# Patient Record
Sex: Male | Born: 2005 | Race: Black or African American | Hispanic: No | Marital: Single | Smoking: Current every day smoker
Health system: Southern US, Community
[De-identification: ages and names within clinical notes are randomized; demographics above are authoritative.]

## PROBLEM LIST (undated history)

## (undated) DIAGNOSIS — T7840XA Allergy, unspecified, initial encounter: Secondary | ICD-10-CM

## (undated) HISTORY — DX: Allergy, unspecified, initial encounter: T78.40XA

## (undated) HISTORY — PX: TONSILLECTOMY: SUR1361

---
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
## 2023-03-27 ENCOUNTER — Emergency Department (HOSPITAL_BASED_OUTPATIENT_CLINIC_OR_DEPARTMENT_OTHER)
Admission: EM | Admit: 2023-03-27 | Discharge: 2023-03-27 | Disposition: A | Discharge status: Home / Self Care | Payer: Medicaid Other | Attending: Emergency Medicine | Admitting: Emergency Medicine

## 2023-03-27 ENCOUNTER — Encounter (HOSPITAL_BASED_OUTPATIENT_CLINIC_OR_DEPARTMENT_OTHER): Payer: Self-pay | Admitting: Emergency Medicine

## 2023-03-27 ENCOUNTER — Emergency Department (HOSPITAL_BASED_OUTPATIENT_CLINIC_OR_DEPARTMENT_OTHER): Payer: Medicaid Other | Admitting: Radiology

## 2023-03-27 DIAGNOSIS — R4020 Unspecified coma: Secondary | ICD-10-CM | POA: Diagnosis not present

## 2023-03-27 DIAGNOSIS — R42 Dizziness and giddiness: Secondary | ICD-10-CM | POA: Diagnosis not present

## 2023-03-27 DIAGNOSIS — R55 Syncope and collapse: Secondary | ICD-10-CM | POA: Diagnosis not present

## 2023-03-27 LAB — BASIC METABOLIC PANEL
Anion gap: 7 (ref 5–15)
BUN: 5 mg/dL — ABNORMAL LOW (ref 6–20)
CO2: 31 mmol/L (ref 22–32)
Calcium: 9.3 mg/dL (ref 8.9–10.3)
Chloride: 101 mmol/L (ref 98–111)
Creatinine, Ser: 0.78 mg/dL (ref 0.61–1.24)
GFR, Estimated: >60 mL/min (ref >60)
Glucose, Bld: 85 mg/dL (ref 70–99)
Potassium: 3.9 mmol/L (ref 3.5–5.1)
Sodium: 139 mmol/L (ref 135–145)

## 2023-03-27 LAB — CBC WITH DIFFERENTIAL/PLATELET
Abs Immature Granulocytes: 0.01 10*3/uL (ref 0.00–0.07)
Basophils Absolute: 0.1 10*3/uL (ref 0.0–0.1)
Basophils Relative: 2 %
Eosinophils Absolute: 0.2 10*3/uL (ref 0.0–0.5)
Eosinophils Relative: 5 %
HCT: 43.8 % (ref 39.0–52.0)
Hemoglobin: 15 g/dL (ref 13.0–17.0)
Immature Granulocytes: 0 %
Lymphocytes Relative: 38 %
Lymphs Abs: 1.7 10*3/uL (ref 0.7–4.0)
MCH: 29.8 pg (ref 26.0–34.0)
MCHC: 34.2 g/dL (ref 30.0–36.0)
MCV: 86.9 fL (ref 80.0–100.0)
Monocytes Absolute: 0.4 10*3/uL (ref 0.1–1.0)
Monocytes Relative: 9 %
Neutro Abs: 2.2 10*3/uL (ref 1.7–7.7)
Neutrophils Relative %: 46 %
Platelets: 300 10*3/uL (ref 150–400)
RBC: 5.04 MIL/uL (ref 4.22–5.81)
RDW: 13.1 % (ref 11.5–15.5)
WBC: 4.6 10*3/uL (ref 4.0–10.5)
nRBC: 0 % (ref 0.0–0.2)

## 2023-03-27 LAB — URINALYSIS, ROUTINE W REFLEX MICROSCOPIC
Bilirubin Urine: NEGATIVE
Glucose, UA: NEGATIVE mg/dL
Hgb urine dipstick: NEGATIVE
Ketones, ur: NEGATIVE mg/dL
Leukocytes,Ua: NEGATIVE
Nitrite: NEGATIVE
Protein, ur: NEGATIVE mg/dL
Specific Gravity, Urine: 1.014 (ref 1.005–1.030)
pH: 7 (ref 5.0–8.0)

## 2023-03-27 LAB — CBG MONITORING, ED
Glucose-Capillary: 85 mg/dL (ref 70–99)
Glucose-Capillary: 84 mg/dL (ref 70–99)

## 2023-03-27 NOTE — Discharge Instructions (Addendum)
 Please follow-up outpatient with a primary care provider for further management.  Your orthostatic vitals were reassuring as was your EKG, chest x-ray and screening laboratory workup.  Your symptoms are consistent with likely vasovagal syncope which is a benign cause of syncope.  Drink plenty of fluids to maintain hydration, if you have recurrent episodes of passing out, this would likely warrant follow-up with a cardiologist.

## 2023-03-27 NOTE — ED Notes (Signed)
Pt brought back to triage. For VS recheck. Denies dizziness/lightheaded. Reports 6/10 headache with nausea. No head injury from LOC today. Pt sts he was standing when he eased himself to the ground.

## 2023-03-27 NOTE — ED Triage Notes (Signed)
Reports LOC today around 1pm Was talking to someone and "fainted"  Lasted a few seconds and  felt weak after  Similar episode 2 weeks ago

## 2023-03-27 NOTE — ED Provider Notes (Signed)
 [place] EMERGENCY DEPARTMENT AT [facility] Provider Note   CSN: [id number] Arrival date & time: 03/27/23  1434     History  Chief Complaint  Patient presents with   Loss of Consciousness    Brad Curry is a 18 y.o. male.   Loss of Consciousness    18 year old male presents to the emergency department after syncopal episode.  The patient states that he lost consciousness today around 1 PM.  He felt a prodrome of lightheadedness.  He denied any chest pain, shortness of breath, nausea.  He had a similar episode 2 weeks with a similar prodrome of lightheadedness and mild diaphoresis.  He denies any history of sudden cardiac death in the family, no history of passing out sports.  Currently asymptomatic at the time of my evaluation.  Home Medications Prior to Admission medications   Medication Sig Start Date End Date Taking? Authorizing Provider  cetirizine  (ZYRTEC ) 10 MG tablet Take 1 tablet (10 mg total) by mouth daily. 06/19/17   Leta Crazier, MD  fluticasone  (FLONASE ) 50 MCG/ACT nasal spray Place 1 spray into both nostrils daily. 1 spray in each nostril every day Patient not taking: Reported on 03/22/2022 06/19/17   Leta Crazier, MD  naproxen  (NAPROSYN ) 500 MG tablet Take 1 tablet (500 mg total) by mouth 2 (two) times daily. Patient not taking: Reported on 03/22/2022 01/12/22   Bero, Michael M, MD      Allergies    Cinnamon    Review of Systems   Review of Systems  Cardiovascular:  Positive for syncope.  Neurological:  Positive for syncope and light-headedness.  All other systems reviewed and are negative.   Physical Exam Updated Vital Signs BP 132/83   Pulse 68   Temp 98.6 F (37 C)   Resp 18   SpO2 100%  Physical Exam Vitals and nursing note reviewed.  Constitutional:      General: He is not in acute distress.    Appearance: He is well-developed.  HENT:     Head: Normocephalic and atraumatic.  Eyes:     Conjunctiva/sclera: Conjunctivae  normal.  Cardiovascular:     Rate and Rhythm: Normal rate and regular rhythm.     Heart sounds: No murmur heard. Pulmonary:     Effort: Pulmonary effort is normal. No respiratory distress.     Breath sounds: Normal breath sounds.  Abdominal:     Palpations: Abdomen is soft.     Tenderness: There is no abdominal tenderness.  Musculoskeletal:        General: No swelling.     Cervical back: Neck supple.  Skin:    General: Skin is warm and dry.     Capillary Refill: Capillary refill takes less than 2 seconds.  Neurological:     Mental Status: He is alert.     Comments: MENTAL STATUS EXAM:    Orientation: Alert and oriented to person, place and time.  Memory: Cooperative, follows commands well.  Language: Speech is clear and language is normal.   CRANIAL NERVES:    CN 2 (Optic): Visual fields intact to confrontation.  CN 3,4,6 (EOM): Pupils equal and reactive to light. Full extraocular eye movement without nystagmus.  CN 5 (Trigeminal): Facial sensation is normal, no weakness of masticatory muscles.  CN 7 (Facial): No facial weakness or asymmetry.  CN 8 (Auditory): Auditory acuity grossly normal.  CN 9,10 (Glossophar): The uvula is midline, the palate elevates symmetrically.  CN 11 (spinal access): Normal sternocleidomastoid and trapezius strength.  CN 12 (Hypoglossal): The tongue is midline. No atrophy or fasciculations.SABRA   MOTOR:  Muscle Strength: 5/5RUE, 5/5LUE, 5/5RLE, 5/5LLE.   COORDINATION:   Intact finger-to-nose, no tremor.   SENSATION:   Intact to light touch all four extremities.  GAIT: Gait normal without ataxia   Psychiatric:        Mood and Affect: Mood normal.     ED Results / Procedures / Treatments   Labs (all labs ordered are listed, but only abnormal results are displayed) Labs Reviewed  BASIC METABOLIC PANEL - Abnormal; Notable for the following components:      Result Value   BUN 5 (*)    All other components within normal limits  CBC WITH  DIFFERENTIAL/PLATELET  URINALYSIS, ROUTINE W REFLEX MICROSCOPIC  CBG MONITORING, ED  CBG MONITORING, ED    EKG EKG Interpretation Date/Time:  Tuesday March 27 2023 15:31:04 EST Ventricular Rate:  64 PR Interval:  156 QRS Duration:  104 QT Interval:  366 QTC Calculation: 377 R Axis:   72  Text Interpretation: Normal sinus rhythm Incomplete right bundle branch block Borderline ECG No previous ECGs available Confirmed by Jerrol Agent ([id number]) on 03/27/2023 7:43:05 PM  Radiology No results found.  Procedures Procedures    Medications Ordered in ED Medications - No data to display  ED Course/ Medical Decision Making/ A&P                                 Medical Decision Making Amount and/or Complexity of Data Reviewed Labs: ordered. Radiology: ordered.    18 year old male presents to the emergency department after syncopal episode.  The patient states that he lost consciousness today around 1 PM.  He felt a prodrome of lightheadedness.  He denied any chest pain, shortness of breath, nausea.  He had a similar episode 2 weeks with a similar prodrome of lightheadedness and mild diaphoresis.  He denies any history of sudden cardiac death in the family, no history of passing out sports.  Currently asymptomatic at the time of my evaluation.  Initial Assessment:   With the patient's presentation of syncope, most likely diagnosis is orthostatic hypotension vs vasovagal episode. Other diagnoses were considered including (but not limited to) arrythmogenic syncope, valvular abnormality, PE, aortic dissection. These are considered less likely due to history of present illness and physical exam findings.   This is most consistent with an acute life/limb threatening illness complicated by underlying chronic conditions. In particular, concerning cardiac etiology, this is less likely to be the etiology given the lack of chest pain, lack of serious comorbidities including heart failure or CAD.    Additionally, patient's history appears more consistent with benign episodes including orthostatic hypotension or vagal episode based on his HPI.  Initial Plan:   Screening labs including CBC and Metabolic panel to evaluate for infectious or metabolic etiology of disease.  Urinalysis with reflex culture ordered to evaluate for UTI or relevant urologic/nephrologic pathology.  CXR to evaluate for structural/infectious intrathoracic pathology.  EKG to evaluate for cardiac pathology. Objective evaluation as below reviewed after administration of IVF/Telemetry monitoring  Initial Study Results:   Laboratory  All laboratory results reviewed without evidence of clinically relevant pathology.   Exceptions include: none, CBG normal   EKG EKG was reviewed independently. Rate, rhythm, axis, intervals all examined and without medically relevant abnormality. ST segments without concerns for elevations.  No evidence of HOCM, Brugada, WPW.  Radiology:  All images reviewed independently. Agree with radiology report at this time.   DG Chest 2 View Result Date: 03/27/2023 CLINICAL DATA:  Loss of consciousness today. Weakness afterwards. Similar episode 2 weeks ago. EXAM: CHEST - 2 VIEW COMPARISON:  02/21/2005 FINDINGS: The heart size and mediastinal contours are within normal limits. Both lungs are clear. The visualized skeletal structures are unremarkable. IMPRESSION: No active cardiopulmonary disease. Electronically Signed   By: Elsie Gravely M.D.   On: 03/27/2023 22:28         Final Assessment and Plan:   Patient reassuring EKG, chest x-ray, laboratory evaluation.  Will monitor for now currently asymptomatic, normal neurologic exam, orthostatics were collected and negative.  Patient is tolerating p.o. Symptoms likely consistent with vasovagal syncope.  Plan for outpatient PCP follow-up.  DC Instructions: Please follow-up outpatient with a primary care provider for further management.  Your  orthostatic vitals were reassuring as was your EKG, chest x-ray and screening laboratory workup.  Your symptoms are consistent with likely vasovagal syncope which is a benign cause of syncope.  Drink plenty of fluids to maintain hydration, if you have recurrent episodes of passing out, this would likely warrant follow-up with a cardiologist.   Clinical Impression:  1. Vasovagal syncope      Discharge     Final Clinical Impression(s) / ED Diagnoses Final diagnoses:  Vasovagal syncope    Rx / DC Orders ED Discharge Orders     None         Jerrol Agent, MD 03/30/23 2252

## 2023-04-26 ENCOUNTER — Ambulatory Visit: Admitting: Urgent Care

## 2023-04-26 ENCOUNTER — Ambulatory Visit: Payer: Self-pay

## 2023-04-26 NOTE — Telephone Encounter (Signed)
 Copied from CRM [id number]. Topic: Clinical - Red Word Triage >> Apr 26, 2023  2:03 PM Payton Doughty wrote: Red Word that prompted transfer to Nurse Triage: fainting/ passed out 2nd time in 2 months [facility] [place] location  Chief Complaint: lightheadedness/ syncopal episodes Symptoms: occasional lightheadedness , feels a flutter in his chest sometimes but does not cause lightheadedness Frequency: since November 2024 Pertinent Negatives: Patient denies any sx at present no sz activity noted when LOC, pt is alert and oriented x 3  Disposition: [] ED /[] Urgent Care (no appt availability in office) / [x] Appointment(In office/virtual)/ []  [facility]/ [] Home Care/ [] Refused Recommended Disposition /[] [facility]/ []  Follow-up with PCP Additional Notes: made NP today at [facility]  Reason for Disposition  [1] All other patients AND [2] now alert and feels fine  (Exception: SIMPLE FAINT due to stress, pain, prolonged standing, or suddenly standing)  Answer Assessment - Initial Assessment Questions 1. ONSET: "How long were you unconscious?" (minutes) "When did it happen?"     seconds 2. CONTENT: "What happened during period of unconsciousness?" (e.g., seizure activity)      no 3. MENTAL STATUS: "Alert and oriented now?" (oriented x 3 = name, month, location)      yes 4. TRIGGER: "What do you think caused the fainting?" "What were you doing just before you fainted?"  (e.g., exercise, sudden standing up, prolonged standing)     Playing with brother Jumping, broke his fast and  5. RECURRENT SYMPTOM: "Have you ever passed out before?" If Yes, ask: "When was the last time?" and "What happened that time?"      2 months got lightheaded and passed out had hit head with headache a couple of months 6. INJURY: "Did you sustain any injury during the fall?"      Sister caught  7. CARDIAC SYMPTOMS: "Have you had any of the following symptoms: chest pain, difficulty breathing,  palpitations?"     Palpitations  twice went away on it own  8. NEUROLOGIC SYMPTOMS: "Have you had any of the following symptoms: headache, numbness, vertigo, weakness?"     Lightheadedness, gets vertigo, feels like losing balance 9. GI SYMPTOMS: "Have you had any of the following symptoms: abdomen pain, vomiting, diarrhea, blood in stools?"     no 10. OTHER SYMPTOMS: "Do you have any other symptoms?"       Hot and lightness- lightheaded episodes: every 2 days late November , hands sweating everyday  11. PREGNANCY: "Is there any chance you are pregnant?" "When was your last menstrual period?"       N/a  Protocols used: Fainting-A-AH

## 2023-05-01 ENCOUNTER — Encounter: Payer: Self-pay | Admitting: Urgent Care

## 2023-05-01 ENCOUNTER — Ambulatory Visit: Attending: Urgent Care

## 2023-05-01 ENCOUNTER — Ambulatory Visit (INDEPENDENT_AMBULATORY_CARE_PROVIDER_SITE_OTHER): Admitting: Urgent Care

## 2023-05-01 VITALS — BP 123/76 | HR 56 | Ht 69.0 in | Wt 115.8 lb

## 2023-05-01 DIAGNOSIS — R001 Bradycardia, unspecified: Secondary | ICD-10-CM

## 2023-05-01 DIAGNOSIS — R634 Abnormal weight loss: Secondary | ICD-10-CM | POA: Insufficient documentation

## 2023-05-01 DIAGNOSIS — R42 Dizziness and giddiness: Secondary | ICD-10-CM

## 2023-05-01 DIAGNOSIS — I4510 Unspecified right bundle-branch block: Secondary | ICD-10-CM

## 2023-05-01 DIAGNOSIS — R630 Anorexia: Secondary | ICD-10-CM | POA: Diagnosis not present

## 2023-05-01 LAB — CBC WITH DIFFERENTIAL/PLATELET
Basophils Absolute: 0.1 10*3/uL (ref 0.0–0.1)
Basophils Relative: 2.4 % (ref 0.0–3.0)
Eosinophils Absolute: 0.3 10*3/uL (ref 0.0–0.7)
Eosinophils Relative: 5 % (ref 0.0–5.0)
HCT: 50.1 % — ABNORMAL HIGH (ref 36.0–49.0)
Hemoglobin: 16.7 g/dL — ABNORMAL HIGH (ref 12.0–16.0)
Lymphocytes Relative: 42.2 % (ref 24.0–48.0)
Lymphs Abs: 2.4 10*3/uL (ref 0.7–4.0)
MCHC: 33.3 g/dL (ref 31.0–37.0)
MCV: 90.8 fl (ref 78.0–98.0)
Monocytes Absolute: 0.5 10*3/uL (ref 0.1–1.0)
Monocytes Relative: 8.8 % (ref 3.0–12.0)
Neutro Abs: 2.3 10*3/uL (ref 1.4–7.7)
Neutrophils Relative %: 41.6 % — ABNORMAL LOW (ref 43.0–71.0)
Platelets: 339 10*3/uL (ref 150.0–575.0)
RBC: 5.52 Mil/uL (ref 3.80–5.70)
RDW: 13.2 % (ref 11.4–15.5)
WBC: 5.6 10*3/uL (ref 4.5–13.5)

## 2023-05-01 LAB — VITAMIN D 25 HYDROXY (VIT D DEFICIENCY, FRACTURES): VITD: 15.07 ng/mL — ABNORMAL LOW (ref 30.00–100.00)

## 2023-05-01 LAB — COMPREHENSIVE METABOLIC PANEL
ALT: 10 U/L (ref 0–53)
AST: 13 U/L (ref 0–37)
Albumin: 5.1 g/dL (ref 3.5–5.2)
Alkaline Phosphatase: 50 U/L — ABNORMAL LOW (ref 52–171)
BUN: 8 mg/dL (ref 6–23)
CO2: 31 meq/L (ref 19–32)
Calcium: 10 mg/dL (ref 8.4–10.5)
Chloride: 103 meq/L (ref 96–112)
Creatinine, Ser: 0.83 mg/dL (ref 0.40–1.50)
GFR: 126.73 mL/min (ref >60.00)
Glucose, Bld: 93 mg/dL (ref 70–99)
Potassium: 4.4 meq/L (ref 3.5–5.1)
Sodium: 141 meq/L (ref 135–145)
Total Bilirubin: 1.3 mg/dL — ABNORMAL HIGH (ref 0.2–1.2)
Total Protein: 7.7 g/dL (ref 6.0–8.3)

## 2023-05-01 LAB — B12 AND FOLATE PANEL
Folate: 9 ng/mL (ref >5.9)
Vitamin B-12: 1081 pg/mL — ABNORMAL HIGH (ref 211–911)

## 2023-05-01 LAB — TSH: TSH: 1.09 u[IU]/mL (ref 0.40–5.00)

## 2023-05-01 LAB — HEMOGLOBIN A1C: Hgb A1c MFr Bld: 5.1 % (ref 4.6–6.5)

## 2023-05-01 LAB — MAGNESIUM: Magnesium: 2.3 mg/dL (ref 1.5–2.5)

## 2023-05-01 NOTE — Progress Notes (Unsigned)
 EP to read.

## 2023-05-01 NOTE — Progress Notes (Signed)
 New Patient Office Visit  Subjective:  Patient ID: Brad Curry, male    DOB: 10-20-2005  Age: 18 y.o. MRN: [id number]  CC:  Chief Complaint  Patient presents with   Establish Care    New est care. Pt states he has been fainting several times this year with no known cause. He said he gets really hot a light headed before passing out.    HPI Brad Curry presents to establish care.  Discussed the use of AI scribe software for clinical note transcription with the patient, who gave verbal consent to proceed.  History of Present Illness   The patient presents with episodes of lightheadedness and fainting sensation. He is accompanied by his mother.  He experiences episodes of feeling very hot internally and lightheaded, often after physical activity. These sensations occur quickly, leading to a loss of balance and sometimes falling, though he does not lose consciousness. The episodes have only occurred at home and are not linked to any specific time of day or prolonged rest. During these episodes, his hands sweat profusely. Since January 2025, he has had three episodes, with the first sensations of lightheadedness beginning in December 2024. He visited the emergency room for the second episode, where an EKG was performed, showing incomplete RBBB. No palpitations, shortness of breath, or chest pain are present. Sitting down or elevating his legs helps alleviate the lightheadedness, with symptoms resolving in about five to ten minutes after resting and drinking water.  He has a decreased appetite, often eating only one meal a day, which he attributes to forgetting to eat rather than a lack of food availability or dislike of the food. He has lost about seven pounds in the past two weeks, which he attributes to fasting for Ramadan. No abdominal pain or aversion to food.  He uses a nicotine vape three to four times an hour and has been doing so for about two years. He does not consume alcohol or use  illicit drugs.       Outpatient Encounter Medications as of 05/01/2023  Medication Sig   cetirizine (ZYRTEC) 10 MG tablet Take 1 tablet (10 mg total) by mouth daily. (Patient not taking: Reported on 05/01/2023)   fluticasone (FLONASE) 50 MCG/ACT nasal spray Place 1 spray into both nostrils daily. 1 spray in each nostril every day (Patient not taking: Reported on 05/01/2023)   naproxen (NAPROSYN) 500 MG tablet Take 1 tablet (500 mg total) by mouth 2 (two) times daily. (Patient not taking: Reported on 05/01/2023)   No facility-administered encounter medications on file as of 05/01/2023.    Past Medical History:  Diagnosis Date   Allergy    seasonal    Past Surgical History:  Procedure Laterality Date   TONSILLECTOMY  2007    Family History  Problem Relation Age of Onset   Hypertension Father    Hyperlipidemia Father     Social History   Socioeconomic History   Marital status: Single    Spouse name: Not on file   Number of children: Not on file   Years of education: Not on file   Highest education level: Not on file  Occupational History   Not on file  Tobacco Use   Smoking status: Every Day    Types: E-cigarettes    Start date: 05/01/2022   Smokeless tobacco: Never   Tobacco comments:    Nicotine containing vape  Substance and Sexual Activity   Alcohol use: No   Drug use: Never  Sexual activity: Never  Other Topics Concern   Not on file  Social History Narrative   Lives with parents, and 3 siblings.  No pets.  No tobacco exposure.     Social Drivers of Corporate investment banker Strain: Not on file  Food Insecurity: Not on file  Transportation Needs: Not on file  Physical Activity: Not on file  Stress: Not on file  Social Connections: Not on file  Intimate Partner Violence: Not on file    ROS: as noted in HPI  Objective:  BP 123/76   Pulse (!) 56   Ht 5\' 9"  (1.753 m)   Wt 115 lb 12.8 oz (52.5 kg)   SpO2 98%   BMI 17.10 kg/m   Physical  Exam Vitals and nursing note reviewed. Exam conducted with a chaperone present.  Constitutional:      General: He is not in acute distress.    Appearance: Normal appearance. He is not ill-appearing, toxic-appearing or diaphoretic.     Comments: Very thin but not cachectic in appearance  HENT:     Head: Normocephalic and atraumatic.     Right Ear: Tympanic membrane, ear canal and external ear normal. There is no impacted cerumen.     Left Ear: Tympanic membrane, ear canal and external ear normal. There is no impacted cerumen.     Nose: Nose normal.     Mouth/Throat:     Mouth: Mucous membranes are moist.     Pharynx: Oropharynx is clear. No oropharyngeal exudate or posterior oropharyngeal erythema.  Eyes:     General: No scleral icterus.       Right eye: No discharge.        Left eye: No discharge.     Extraocular Movements: Extraocular movements intact.     Pupils: Pupils are equal, round, and reactive to light.  Neck:     Thyroid: No thyroid mass, thyromegaly or thyroid tenderness.     Vascular: No carotid bruit.  Cardiovascular:     Rate and Rhythm: Regular rhythm. Bradycardia present.     Pulses: Normal pulses.     Heart sounds: Normal heart sounds. No murmur heard. Pulmonary:     Effort: Pulmonary effort is normal. No respiratory distress.     Breath sounds: Normal breath sounds. No stridor. No wheezing or rhonchi.  Abdominal:     General: Abdomen is flat. Bowel sounds are normal. There is no distension.     Palpations: Abdomen is soft. There is no mass.     Tenderness: There is no abdominal tenderness. There is no guarding.  Musculoskeletal:     Cervical back: Normal range of motion and neck supple. No rigidity or tenderness.     Right lower leg: No edema.     Left lower leg: No edema.  Lymphadenopathy:     Cervical: No cervical adenopathy.  Skin:    General: Skin is warm and dry.     Coloration: Skin is not jaundiced.     Findings: No bruising, erythema or rash.   Neurological:     General: No focal deficit present.     Mental Status: He is alert and oriented to person, place, and time.     Sensory: No sensory deficit.     Motor: No weakness.  Psychiatric:        Mood and Affect: Mood normal.        Behavior: Behavior normal.     Last CBC Lab Results  Component Value Date  WBC 4.6 03/27/2023   HGB 15.0 03/27/2023   HCT 43.8 03/27/2023   MCV 86.9 03/27/2023   MCH 29.8 03/27/2023   RDW 13.1 03/27/2023   PLT 300 03/27/2023   Last metabolic panel Lab Results  Component Value Date   GLUCOSE 85 03/27/2023   NA 139 03/27/2023   K 3.9 03/27/2023   CL 101 03/27/2023   CO2 31 03/27/2023   BUN 5 (L) 03/27/2023   CREATININE 0.78 03/27/2023   GFRNONAA >60 03/27/2023   CALCIUM 9.3 03/27/2023   ANIONGAP 7 03/27/2023      Assessment & Plan:  Lightheadedness -     CBC with Differential/Platelet -     TSH -     VITAMIN D 25 Hydroxy (Vit-D Deficiency, Fractures) -     Comprehensive metabolic panel -     B12 and Folate Panel -     Iron, TIBC and Ferritin Panel -     Hemoglobin A1c -     Magnesium -     LONG TERM MONITOR (3-14 DAYS); Future -     ECHOCARDIOGRAM COMPLETE; Future  Weight loss, unintentional -     TSH  Decreased appetite  Incomplete right bundle branch block -     EKG 12-Lead -     CBC with Differential/Platelet -     TSH -     Magnesium -     LONG TERM MONITOR (3-14 DAYS); Future -     ECHOCARDIOGRAM COMPLETE; Future  Bradycardia, sinus -     TSH -     Comprehensive metabolic panel -     LONG TERM MONITOR (3-14 DAYS); Future -     ECHOCARDIOGRAM COMPLETE; Future  Assessment and Plan    Syncope Episodes of lightheadedness and faintness with falls, no loss of consciousness. Possible hypoglycemia due to weight loss and decreased appetite. Right bundle branch block noted on previous EKG, bradycardia noted today. Differential includes hypoglycemia, cardiovascular issues, and other vascular causes. - Repeat EKG  today showing marked bradycaria, no further RBBB noted. - Order lab tests including glucose levels to evaluate syncope causes. - Obtain echocardiogram to rule out structural heart issues and/or ASD/VSD. - Educated on regular meals to prevent hypoglycemia and increased water intake to prevent dehydration/ vasovagal  Decreased appetite and weight loss Decreased appetite and unintentional weight loss of seven pounds over two weeks, attributed to infrequent meals due to busy schedule. - Encourage regular meal consumption to prevent further weight loss and potential hypoglycemia. (However pt currently participating in Ramadan) - Monitor weight and appetite changes.  Nicotine use Nicotine-containing vape use multiple times per hour for two years, potential contributor to cardiovascular symptoms and appetite suppression. - Discussed health risks of nicotine use, including cardiovascular effects and appetite suppression.        No follow-ups on file.   Maretta Bees, PA

## 2023-05-01 NOTE — Patient Instructions (Signed)
 We drew labs today to assess for possible causes of your symptoms.  Additionally, your EKG was abnormal both in the ER and today. I would like you to perform a 14 day heart monitor and also obtain an ECHO (ultrasound of the heart)  Please call the number below to schedule this workup [facility] Address: [address], [place], [place] [address] Phone: [phone]   Try to drink more fluids. It is also important that you try to eat at least 1800kcals per day.   Please schedule a follow up office visit in 3 weeks for a follow up and recheck.

## 2023-05-02 ENCOUNTER — Encounter: Payer: Self-pay | Admitting: Urgent Care

## 2023-05-02 DIAGNOSIS — E559 Vitamin D deficiency, unspecified: Secondary | ICD-10-CM

## 2023-05-02 LAB — IRON,TIBC AND FERRITIN PANEL
%SAT: 38 % (ref 16–48)
Ferritin: 80 ng/mL (ref 38–380)
Iron: 127 ug/dL (ref 27–164)
TIBC: 334 ug/dL (ref 271–448)

## 2023-05-02 MED ORDER — VITAMIN D (ERGOCALCIFEROL) 1.25 MG (50000 UNIT) PO CAPS: 50000.0000 [IU] | ORAL_CAPSULE | ORAL | 0 refills | Status: DC

## 2023-05-21 ENCOUNTER — Ambulatory Visit (INDEPENDENT_AMBULATORY_CARE_PROVIDER_SITE_OTHER)

## 2023-05-21 DIAGNOSIS — R42 Dizziness and giddiness: Secondary | ICD-10-CM | POA: Diagnosis not present

## 2023-05-21 DIAGNOSIS — R001 Bradycardia, unspecified: Secondary | ICD-10-CM | POA: Diagnosis not present

## 2023-05-21 DIAGNOSIS — I4510 Unspecified right bundle-branch block: Secondary | ICD-10-CM

## 2023-05-21 LAB — ECHOCARDIOGRAM COMPLETE
Area-P 1/2: 3.85 cm2
S' Lateral: 2.61 cm

## 2023-05-22 ENCOUNTER — Encounter: Payer: Self-pay | Admitting: Urgent Care

## 2023-05-22 ENCOUNTER — Ambulatory Visit (INDEPENDENT_AMBULATORY_CARE_PROVIDER_SITE_OTHER): Admitting: Urgent Care

## 2023-05-22 VITALS — BP 116/77 | HR 69 | Wt 117.8 lb

## 2023-05-22 DIAGNOSIS — R630 Anorexia: Secondary | ICD-10-CM

## 2023-05-22 DIAGNOSIS — R6881 Early satiety: Secondary | ICD-10-CM | POA: Diagnosis not present

## 2023-05-22 DIAGNOSIS — R42 Dizziness and giddiness: Secondary | ICD-10-CM

## 2023-05-22 DIAGNOSIS — E559 Vitamin D deficiency, unspecified: Secondary | ICD-10-CM | POA: Diagnosis not present

## 2023-05-22 DIAGNOSIS — Z7289 Other problems related to lifestyle: Secondary | ICD-10-CM | POA: Diagnosis not present

## 2023-05-22 NOTE — Patient Instructions (Signed)
 Continue taking Vit D once weekly until gone.  Cut back and/or QUIT vaping. See if this helps with your appetite. If you need assistance with quitting, please let me know. We could consider a medication called Bupropion. If your appetite does not improve after cessation of vaping, we would consider a gastric emptying evaluation.   INCREASE your water intake. Try to drink between 6-8 16oz water bottles daily.  Return in 3-4 months

## 2023-05-23 ENCOUNTER — Encounter: Payer: Self-pay | Admitting: Urgent Care

## 2023-05-23 NOTE — Progress Notes (Signed)
 Established Patient Office Visit  Subjective:  Patient ID: Brad Curry, male    DOB: Apr 10, 2005  Age: 18 y.o. MRN: [id number]  Chief Complaint  Patient presents with   Follow-up    3 week follow up.    HPI  Discussed the use of AI scribe software for clinical note transcription with the patient, who gave verbal consent to proceed.  History of Present Illness   Brad Curry is a 18 year old male who presents with persistent lightheadedness.  He experiences persistent lightheadedness, primarily during the day when he is active, such as working or moving around. The lightheadedness occurs especially when bending down and standing up, indicating a positional component. No lightheadedness is felt while sitting still.  He recently completed a Holter monitor test, which he returned a few days ago. An echocardiogram was performed and reported as normal. There has been no improvement in symptoms since starting vitamin D supplementation.  He drinks about two 20-ounce bottles of water daily, which may be insufficient given his activity level. He has a history of consuming energy drinks but stopped around the beginning of Ramadan. He has been vaping for stress relief for about four to five years.  He has a low appetite and often forgets to eat when busy. He describes himself as always having a low appetite and feels full quickly. His family notes that his younger siblings eat more than he does. No nausea or pain is associated with eating. His mother believes his appetite changes started after her initiated vaping a few years ago.  Recent lab work showed normal magnesium, hemoglobin A1c, iron studies, and thyroid function. His vitamin D was low, and he has started supplementation. His B12 was high, likely due to previous energy drink consumption.      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/01/2023   Allergic rhinitis 07/03/2017   Injury of left shoulder 06/30/2014   Food allergy 02/10/2013   Past Medical History:  Diagnosis Date   Allergy    seasonal   Past Surgical History:  Procedure Laterality Date   TONSILLECTOMY  2007   Social History   Tobacco Use   Smoking status: Every Day    Types: E-cigarettes    Start date: 05/01/2022   Smokeless tobacco: Never   Tobacco comments:    Nicotine containing vape  Substance Use Topics   Alcohol use: No   Drug use: Never      ROS: as noted in HPI  Objective:     BP 116/77   Pulse 69   Wt 117 lb 12.8 oz (53.4 kg)   SpO2 99%   BMI 17.40 kg/m  BP Readings from Last 3 Encounters:  05/22/23 116/77  05/01/23 123/76  03/27/23 132/83   Wt Readings from Last 3 Encounters:  05/22/23 117 lb 12.8 oz (53.4 kg) (3%, Z= -1.95)*  05/01/23 115 lb 12.8 oz (52.5 kg) (2%, Z= -2.08)*  03/22/22 120 lb 9.6 oz (54.7 kg) (6%, Z= -1.56)*   * Growth percentiles are based on CDC (Boys, 2-20 Years) data.      Physical Exam Vitals and nursing note reviewed. Exam conducted with a chaperone present.  Constitutional:      General: He is not in acute distress.    Appearance: Normal appearance. He is not ill-appearing, toxic-appearing or  diaphoretic.  HENT:     Head: Normocephalic and atraumatic.     Right Ear: External ear normal.     Left Ear: External ear normal.     Nose: Nose normal.  Eyes:     General: No scleral icterus.    Pupils: Pupils are equal, round, and reactive to light.  Cardiovascular:     Rate and Rhythm: Normal rate.  Pulmonary:     Effort: Pulmonary effort is normal. No respiratory distress.  Skin:    General: Skin is warm and dry.     Findings: No erythema or rash.  Neurological:     General: No focal deficit present.     Mental Status: He is alert and oriented to person, place, and time.      No results found  for any visits on 05/22/23.  Last CBC Lab Results  Component Value Date   WBC 5.6 05/01/2023   HGB 16.7 (H) 05/01/2023   HCT 50.1 (H) 05/01/2023   MCV 90.8 05/01/2023   MCH 29.8 03/27/2023   RDW 13.2 05/01/2023   PLT 339.0 05/01/2023   Last metabolic panel Lab Results  Component Value Date   GLUCOSE 93 05/01/2023   NA 141 05/01/2023   K 4.4 05/01/2023   CL 103 05/01/2023   CO2 31 05/01/2023   BUN 8 05/01/2023   CREATININE 0.83 05/01/2023   GFR 126.73 05/01/2023   CALCIUM 10.0 05/01/2023   PROT 7.7 05/01/2023   ALBUMIN 5.1 05/01/2023   BILITOT 1.3 (H) 05/01/2023   ALKPHOS 50 (L) 05/01/2023   AST 13 05/01/2023   ALT 10 05/01/2023   ANIONGAP 7 03/27/2023   Last lipids No results found for: "CHOL", "HDL", "LDLCALC", "LDLDIRECT", "TRIG", "CHOLHDL" Last hemoglobin A1c Lab Results  Component Value Date   HGBA1C 5.1 05/01/2023   Last thyroid functions Lab Results  Component Value Date   TSH 1.09 05/01/2023   Last vitamin D Lab Results  Component Value Date   VD25OH 15.07 (L) 05/01/2023   Last vitamin B12 and Folate Lab Results  Component Value Date   VITAMINB12 1,081 (H) 05/01/2023   FOLATE 9.0 05/01/2023      The ASCVD Risk score (Arnett DK, et al., 2019) failed to calculate for the following reasons:   The 2019 ASCVD risk score is only valid for ages 40 to 36  Assessment & Plan:  Lightheadedness  Vitamin D insufficiency  Decreased appetite  Early satiety  Current every day vaping  Assessment and Plan    Lightheadedness Intermittent, primarily positional lightheadedness during activities such as car detailing. Symptoms persist despite the end of Ramadan and resumption of regular eating. Normal echocardiogram with ejection fraction of 62-65%, indicating no significant cardiac issues. Labs suggest possible dehydration and elevated hemoglobin, likely due to insufficient fluid intake and nicotine use. Differential includes dehydration and nicotine use  as primary contributors. Increased fluid intake is expected to alleviate symptoms. - Increase water intake to at least eight 16-ounce bottles per day - Recheck labs in 3-4 months to monitor hemoglobin and other parameters - Review Holter monitor results when available and communicate findings via MyChart  Low Appetite and Underweight BMI is 17, considered underweight. Long-standing low appetite with early satiety. No nausea or pain associated with eating. Possible contribution from vaping, which may affect appetite. Differential includes delayed gastric emptying and effects of nicotine use. Reducing or quitting vaping may improve appetite. 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 He expressed willingness to quit, especially after reduced use during Ramadan. Discussed potential use of bupropion if needed for cessation support. Bupropion may also aid in stress relief. - Encourage cessation of vaping - Consider bupropion if unable to quit independently - Monitor for changes in appetite and stress levels  Vitamin D Deficiency Vitamin D levels were very low. He has started supplementation. No expected immediate improvement in lightheadedness as it is not typically a symptom of low vitamin D. Fatigue may improve over time with supplementation. - Continue vitamin D supplementation - Recheck vitamin D levels in 3 months         Return in about 14 weeks (around 08/28/2023).   Maretta Bees, PA

## 2023-06-02 DIAGNOSIS — R42 Dizziness and giddiness: Secondary | ICD-10-CM

## 2023-06-02 DIAGNOSIS — I4510 Unspecified right bundle-branch block: Secondary | ICD-10-CM | POA: Diagnosis not present

## 2023-06-02 DIAGNOSIS — R001 Bradycardia, unspecified: Secondary | ICD-10-CM

## 2023-06-04 ENCOUNTER — Encounter: Payer: Self-pay | Admitting: Urgent Care

## 2023-08-28 ENCOUNTER — Ambulatory Visit: Admitting: Urgent Care

## 2024-03-04 ENCOUNTER — Ambulatory Visit: Payer: Self-pay

## 2024-03-04 NOTE — Telephone Encounter (Signed)
 FYI Only or Action Required?: FYI only for provider: appointment scheduled on Friday.  Patient was last seen in primary care on 05/22/2023 by Lowella Benton CROME, PA.  Called Nurse Triage reporting Loss of Consciousness. Dizziness. Pt Passed out in [employer] 02/19/2024  Symptoms began several months ago.  Interventions attempted: Nothing.  Symptoms are: stable.  Triage Disposition: See PCP Within 2 Weeks  Patient/caregiver understands and will follow disposition?: yes - pt has appt scheduled for Friday. Was not able to come in sooner. Parent will monitor pt and will call EMS if needed.                      Copied from CRM [id number]. Topic: Clinical - Red Word Triage >> Mar 04, 2024  5:40 PM China J wrote: [facility] that prompted transfer to Nurse Triage: Patient fainted today and has been feeling dizzy. Reason for Disposition  Simple fainting is a chronic symptom (has occurred multiple times)  Answer Assessment - Initial Assessment Questions 1. ONSET: How long were you unconscious? (e.g., minutes, seconds) When did it happen?     12/30 2. CONTENT: What happened during the period of unconsciousness? (e.g., seizure activity)      Muscle movement 3. MENTAL STATUS: Alert and oriented now? (e.g., oriented x 3 = name, month, location)      yes 4. TRIGGER: What do you think caused the fainting? What were you doing just before you fainted?  (e.g., exercise, sudden standing up, prolonged standing)     Unsure - Mother thinks it is due to not eating. 5. RECURRENT SYMPTOM: Have you ever passed out before? If Yes, ask: When was the last time? and What happened that time?      yes 6. INJURY: Did you hurt yourself when you fell?      no 7. CARDIAC SYMPTOMS: Have you had any of the following symptoms: chest pain, difficulty breathing, palpitations?     no 8. NEUROLOGIC SYMPTOMS: Have you had any of the following symptoms: headache, numbness, vertigo, weakness?      no 9. GI SYMPTOMS: Have you had any of the following symptoms: abdomen pain, vomiting, diarrhea, blood in stools?     no 10. OTHER SYMPTOMS: Do you have any other symptoms?       no  Protocols used: Fainting-A-AH

## 2024-03-05 NOTE — Telephone Encounter (Signed)
 Pt has appoitment with Joy on 1/16

## 2024-03-07 ENCOUNTER — Ambulatory Visit: Admitting: Medical-Surgical

## 2024-03-11 ENCOUNTER — Encounter: Payer: Self-pay | Admitting: Medical-Surgical

## 2024-03-11 ENCOUNTER — Ambulatory Visit: Admitting: Medical-Surgical

## 2024-03-11 VITALS — BP 110/69 | HR 68 | Temp 98.9°F | Resp 16 | Ht 68.0 in | Wt 128.0 lb

## 2024-03-11 DIAGNOSIS — R42 Dizziness and giddiness: Secondary | ICD-10-CM | POA: Diagnosis not present

## 2024-03-11 DIAGNOSIS — R55 Syncope and collapse: Secondary | ICD-10-CM | POA: Diagnosis not present

## 2024-03-11 DIAGNOSIS — E559 Vitamin D deficiency, unspecified: Secondary | ICD-10-CM | POA: Diagnosis not present

## 2024-03-11 NOTE — Progress Notes (Signed)
" ° °       Established patient visit   History of Present Illness   Discussed the use of AI scribe software for clinical note transcription with the patient, who gave verbal consent to proceed.  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  Dizziness and lightheadedness - Intermittent episodes for approximately one year - Symptoms worsened with movement, walking, exertion, or feeling overheated - Increased frequency of episodes recently - Cold air, sitting, or lying down relieve symptoms - Most recent episode occurred on December 30 while shopping, associated with feeling overheated, wave of lightheadedness, and fall - During episodes, experiences chest tightness and shortness of breath - No associated nausea, vomiting, diarrhea, or abdominal cramping  Bradycardia and vasovagal syncope - Previously diagnosed with bradycardia and possible vasovagal syncope - Cardiac event monitor and laboratory studies performed last year were unremarkable - No recent laboratory studies  Anxiety - Daily anxiety present - Does not limit functioning  Nutrition and hydration - Working on improving nutrition and hydration - Previously skipped meals, now eating more consistently - Reduced caffeine intake since last visit  Functional status - 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  Constitutional:      General: He is not in acute distress.    Appearance: Normal appearance. He is not ill-appearing.  HENT:     Head: Normocephalic and atraumatic.  Cardiovascular:     Rate and Rhythm: Normal rate and regular rhythm.     Pulses: Normal pulses.     Heart sounds: Normal heart sounds. No murmur heard.    No friction rub. No gallop.  Pulmonary:      Effort: Pulmonary effort is normal. No respiratory distress.     Breath sounds: Normal breath sounds.  Skin:    General: Skin is warm and dry.  Neurological:     Mental Status: He is alert and oriented to person, place, and time.  Psychiatric:        Mood and Affect: Mood normal.        Behavior: Behavior normal.        Thought Content: Thought content normal.        Judgment: Judgment normal.    Assessment & Plan   Syncope/lightheadedness Intermittent dizziness and syncope exacerbated by exertion and heat. Cardiology evaluation normal. Possible contributing factors: dehydration, poor nutrition, anxiety. Sinus bradycardia may affect compensatory mechanisms. Discussed vagus nerve role and trigger identification. - Ordered full blood workup including magnesium and vitamin D  levels. - Advised on maintaining hydration and nutrition. - Recommended avoiding syncope triggers, such as running up stairs and getting overheated. - Suggested keeping a 24-hour recall around episodes to identify patterns.  Vitamin D  deficiency Previously identified deficiency potentially contributing to anxiety and energy issues. Importance of vitamin D  in health discussed. - Ordered vitamin D  level. - Advised on resuming vitamin D  supplementation if levels remain low.     Follow up   Return if symptoms worsen or fail to improve. __________________________________ Zada FREDRIK Palin, DNP, APRN, FNP-BC Primary Care and Sports Medicine [facility] [place] "

## 2024-03-12 ENCOUNTER — Ambulatory Visit: Payer: Self-pay | Admitting: Medical-Surgical

## 2024-03-12 LAB — CBC WITH DIFFERENTIAL/PLATELET
Basophils Absolute: 0.1 x10E3/uL (ref 0.0–0.2)
Basos: 1 %
EOS (ABSOLUTE): 0.4 x10E3/uL (ref 0.0–0.4)
Eos: 6 %
Hematocrit: 46.7 % (ref 37.5–51.0)
Hemoglobin: 15.8 g/dL (ref 13.0–17.7)
Immature Grans (Abs): 0 x10E3/uL (ref 0.0–0.1)
Immature Granulocytes: 0 %
Lymphocytes Absolute: 2.1 x10E3/uL (ref 0.7–3.1)
Lymphs: 34 %
MCH: 29.9 pg (ref 26.6–33.0)
MCHC: 33.8 g/dL (ref 31.5–35.7)
MCV: 88 fL (ref 79–97)
Monocytes Absolute: 0.6 x10E3/uL (ref 0.1–0.9)
Monocytes: 10 %
Neutrophils Absolute: 3 x10E3/uL (ref 1.4–7.0)
Neutrophils: 49 %
Platelets: 251 x10E3/uL (ref 150–450)
RBC: 5.28 x10E6/uL (ref 4.14–5.80)
RDW: 13 % (ref 11.6–15.4)
WBC: 6.2 x10E3/uL (ref 3.4–10.8)

## 2024-03-12 LAB — TSH: TSH: 2.78 u[IU]/mL (ref 0.450–4.500)

## 2024-03-12 LAB — COMPREHENSIVE METABOLIC PANEL WITH GFR
ALT: 43 IU/L (ref 0–44)
AST: 54 IU/L — ABNORMAL HIGH (ref 0–40)
Albumin: 4.9 g/dL (ref 4.3–5.2)
Alkaline Phosphatase: 57 IU/L (ref 51–125)
BUN/Creatinine Ratio: 14 (ref 9–20)
BUN: 10 mg/dL (ref 6–20)
Bilirubin Total: 0.6 mg/dL (ref 0.0–1.2)
CO2: 26 mmol/L (ref 20–29)
Calcium: 9.7 mg/dL (ref 8.7–10.2)
Chloride: 100 mmol/L (ref 96–106)
Creatinine, Ser: 0.73 mg/dL — ABNORMAL LOW (ref 0.76–1.27)
Globulin, Total: 2.7 g/dL (ref 1.5–4.5)
Glucose: 78 mg/dL (ref 70–99)
Potassium: 4 mmol/L (ref 3.5–5.2)
Sodium: 138 mmol/L (ref 134–144)
Total Protein: 7.6 g/dL (ref 6.0–8.5)
eGFR: 134 mL/min/1.73

## 2024-03-12 LAB — B12 AND FOLATE PANEL
Folate: 10.3 ng/mL
Vitamin B-12: 690 pg/mL (ref 232–1245)

## 2024-03-12 LAB — PHOSPHORUS: Phosphorus: 4.1 mg/dL (ref 2.8–4.1)

## 2024-03-12 LAB — IRON,TIBC AND FERRITIN PANEL
Ferritin: 61 ng/mL (ref 30–400)
Iron Saturation: 27 % (ref 15–55)
Iron: 102 ug/dL (ref 38–169)
Total Iron Binding Capacity: 376 ug/dL (ref 250–450)
UIBC: 274 ug/dL (ref 111–343)

## 2024-03-12 LAB — VITAMIN D 25 HYDROXY (VIT D DEFICIENCY, FRACTURES): Vit D, 25-Hydroxy: 15 ng/mL — ABNORMAL LOW (ref 30.0–100.0)

## 2024-03-12 LAB — MAGNESIUM: Magnesium: 2.2 mg/dL (ref 1.6–2.3)

## 2024-03-12 LAB — T4, FREE: Free T4: 1.16 ng/dL (ref 0.82–1.77)
